# Patient Record
Sex: Male | Born: 1985 | Race: White | Hispanic: No | Marital: Married | Smoking: Never smoker
Health system: Southern US, Community
[De-identification: ages and names within clinical notes are randomized; demographics above are authoritative.]

## PROBLEM LIST (undated history)

## (undated) HISTORY — PX: WISDOM TOOTH EXTRACTION: SHX21

---

## 2005-03-30 ENCOUNTER — Emergency Department (HOSPITAL_COMMUNITY): Admission: EM | Admit: 2005-03-30 | Discharge: 2005-03-30 | Payer: Self-pay | Admitting: Emergency Medicine

## 2006-08-22 IMAGING — CR DG HAND COMPLETE 3+V*L*
3 series · 3 of 3 positions shown · non-contrast
Comparison: none

CLINICAL DATA: The patient fell from a dear stand today and has a large laceration on the palm of the left hand.   Right chest pain. 
 LEFT HAND - 3 VIEW: 
 There is no fracture, dislocation or radiodense foreign body.

[x hand ap left]
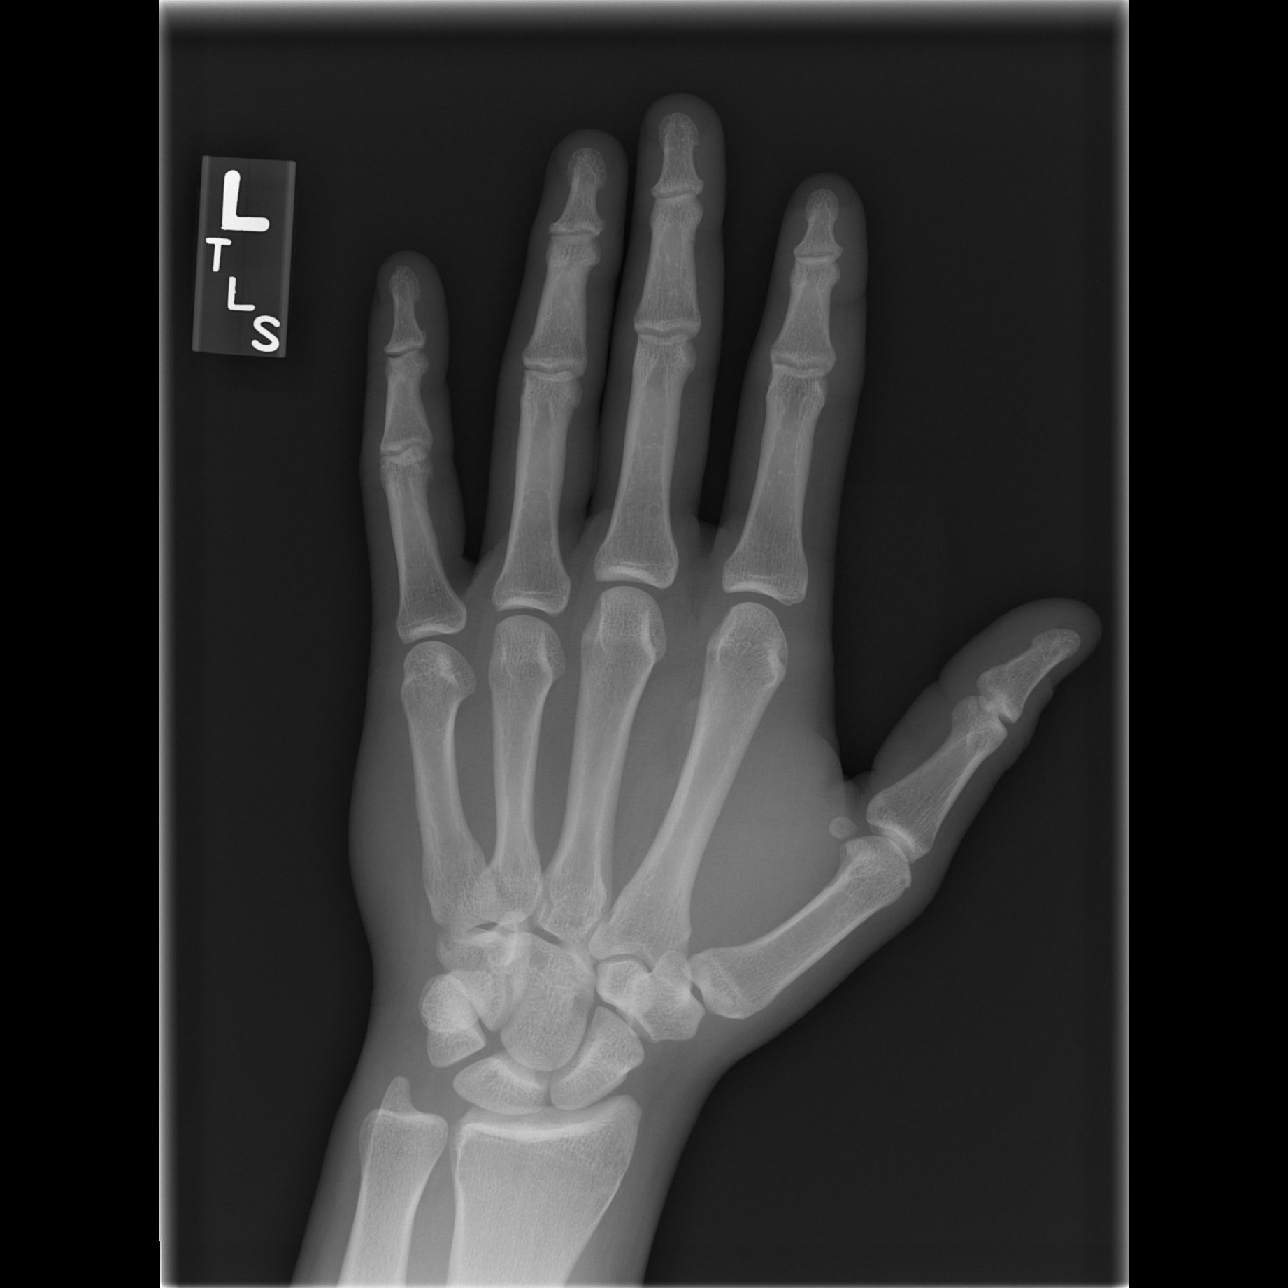

[x hand oblique left]
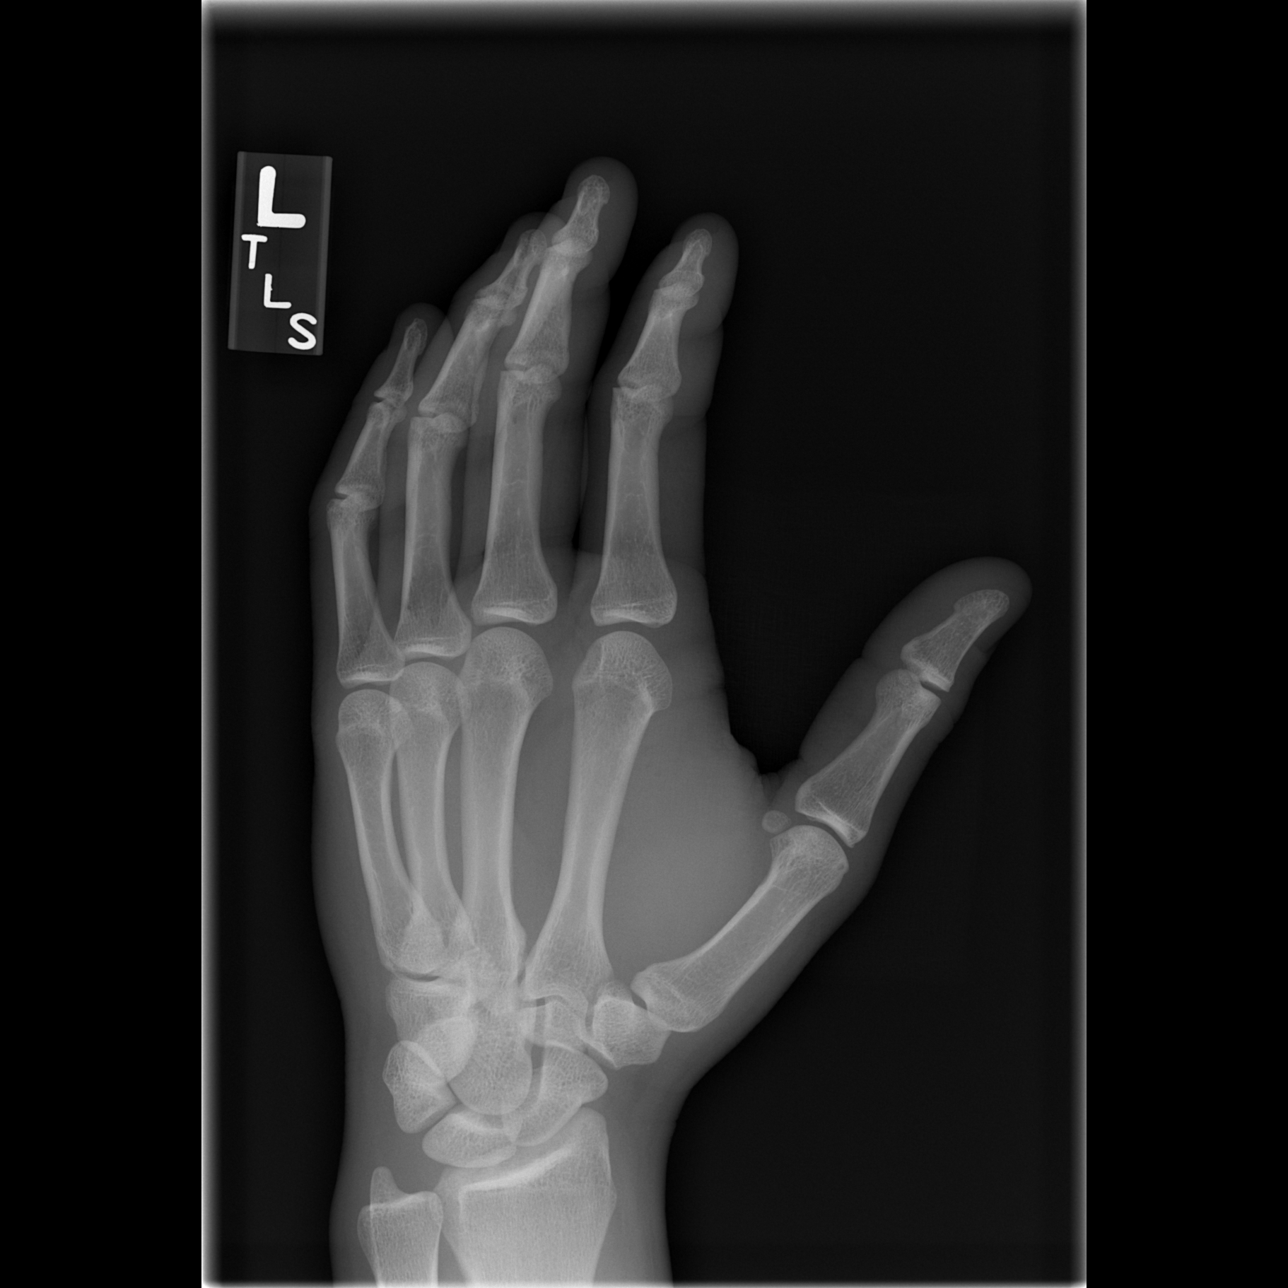

[x hand lat left]
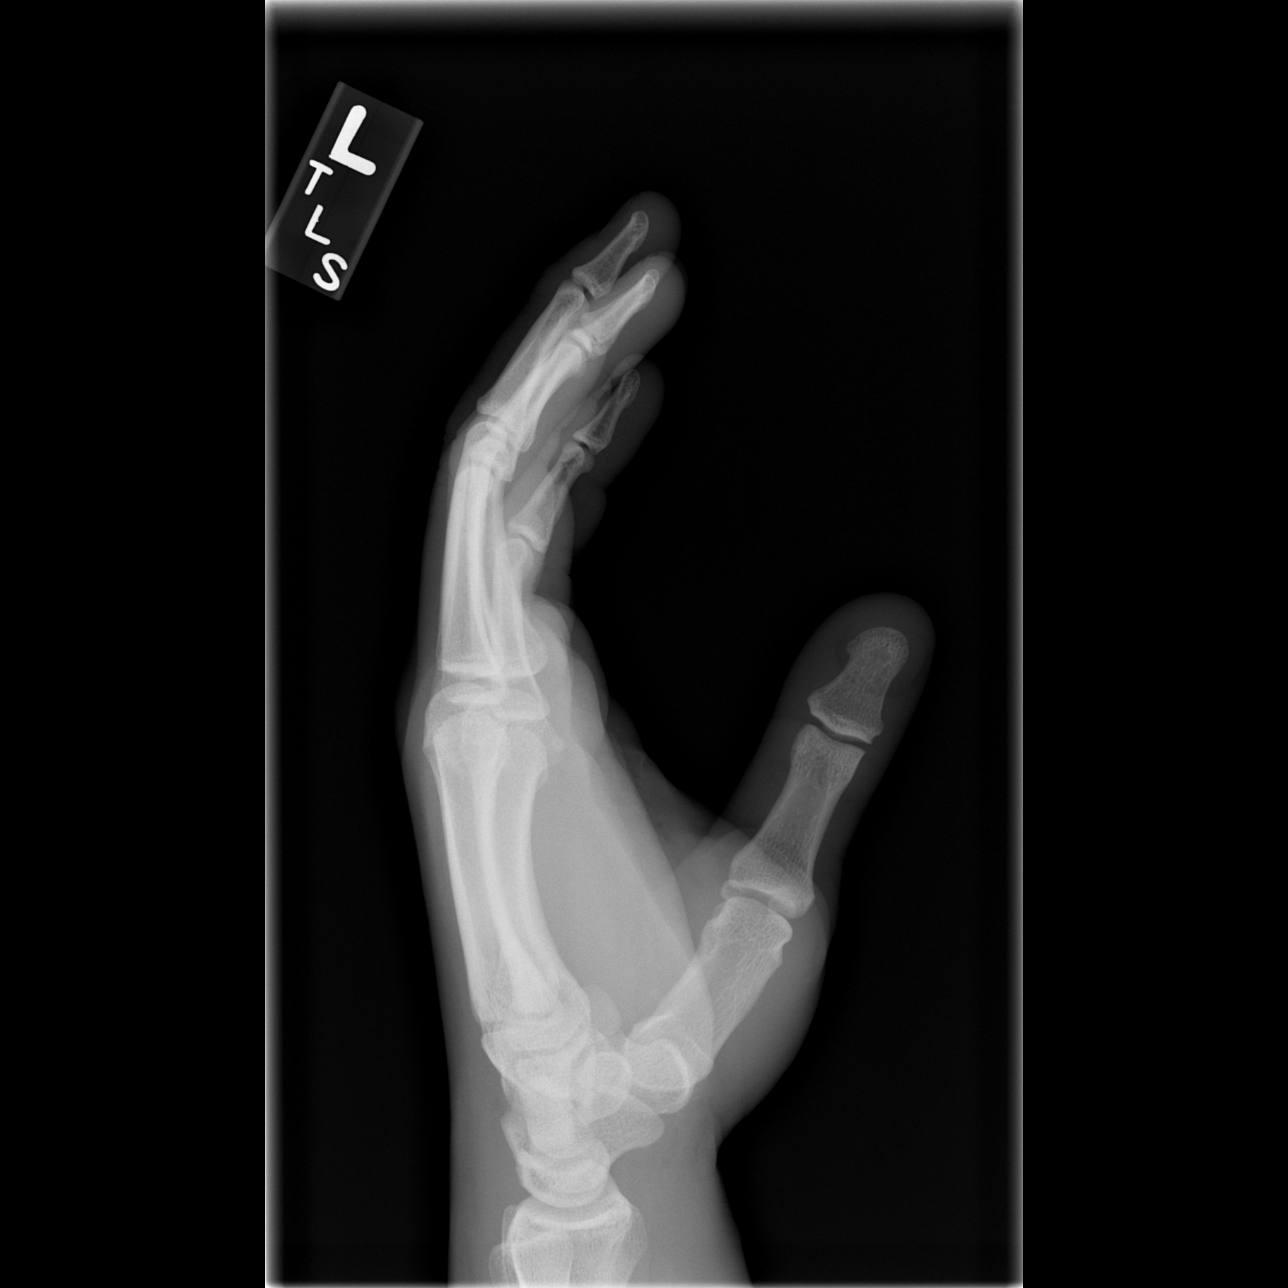

[3 of 3 positions shown; findings below may reference images not displayed]

IMPRESSION: Normal left hand. 
 CHEST - 2 VIEW: 
 The heart size and mediastinal contours are within normal limits.  Both lungs are clear.  The visualized skeletal structures are unremarkable.
IMPRESSION: No active cardiopulmonary disease.

## 2010-01-04 ENCOUNTER — Encounter: Admission: RE | Admit: 2010-01-04 | Discharge: 2010-01-04 | Payer: Self-pay | Admitting: Occupational Medicine

## 2015-10-21 ENCOUNTER — Other Ambulatory Visit: Payer: Self-pay | Admitting: Physician Assistant

## 2015-10-21 DIAGNOSIS — L02216 Cutaneous abscess of umbilicus: Secondary | ICD-10-CM

## 2015-10-22 ENCOUNTER — Ambulatory Visit
Admission: RE | Admit: 2015-10-22 | Discharge: 2015-10-22 | Disposition: A | Discharge status: Home / Self Care | Payer: 59 | Source: Ambulatory Visit | Attending: Physician Assistant | Admitting: Physician Assistant

## 2015-10-22 DIAGNOSIS — L02216 Cutaneous abscess of umbilicus: Secondary | ICD-10-CM

## 2019-09-29 ENCOUNTER — Emergency Department (HOSPITAL_BASED_OUTPATIENT_CLINIC_OR_DEPARTMENT_OTHER): Payer: 59

## 2019-09-29 ENCOUNTER — Other Ambulatory Visit: Payer: Self-pay

## 2019-09-29 ENCOUNTER — Encounter (HOSPITAL_BASED_OUTPATIENT_CLINIC_OR_DEPARTMENT_OTHER): Payer: Self-pay | Admitting: *Deleted

## 2019-09-29 ENCOUNTER — Emergency Department (HOSPITAL_BASED_OUTPATIENT_CLINIC_OR_DEPARTMENT_OTHER)
Admission: EM | Admit: 2019-09-29 | Discharge: 2019-09-29 | Disposition: A | Discharge status: Home / Self Care | Payer: 59 | Attending: Emergency Medicine | Admitting: Emergency Medicine

## 2019-09-29 DIAGNOSIS — R0789 Other chest pain: Secondary | ICD-10-CM | POA: Diagnosis present

## 2019-09-29 DIAGNOSIS — R0602 Shortness of breath: Secondary | ICD-10-CM | POA: Insufficient documentation

## 2019-09-29 DIAGNOSIS — R079 Chest pain, unspecified: Secondary | ICD-10-CM

## 2019-09-29 DIAGNOSIS — R05 Cough: Secondary | ICD-10-CM

## 2019-09-29 DIAGNOSIS — U071 COVID-19: Secondary | ICD-10-CM | POA: Diagnosis not present

## 2019-09-29 DIAGNOSIS — R059 Cough, unspecified: Secondary | ICD-10-CM

## 2019-09-29 LAB — CBC WITH DIFFERENTIAL/PLATELET
Abs Immature Granulocytes: 0.01 10*3/uL (ref 0.00–0.07)
Basophils Absolute: 0 10*3/uL (ref 0.0–0.1)
Basophils Relative: 0 %
Eosinophils Absolute: 0 10*3/uL (ref 0.0–0.5)
Eosinophils Relative: 1 %
HCT: 49.4 % (ref 39.0–52.0)
Hemoglobin: 16.9 g/dL (ref 13.0–17.0)
Immature Granulocytes: 0 %
Lymphocytes Relative: 37 %
Lymphs Abs: 2.2 10*3/uL (ref 0.7–4.0)
MCH: 29 pg (ref 26.0–34.0)
MCHC: 34.2 g/dL (ref 30.0–36.0)
MCV: 84.7 fL (ref 80.0–100.0)
Monocytes Absolute: 0.6 10*3/uL (ref 0.1–1.0)
Monocytes Relative: 10 %
Neutro Abs: 3 10*3/uL (ref 1.7–7.7)
Neutrophils Relative %: 52 %
Platelets: 262 10*3/uL (ref 150–400)
RBC: 5.83 MIL/uL — ABNORMAL HIGH (ref 4.22–5.81)
RDW: 12.9 % (ref 11.5–15.5)
WBC: 5.9 10*3/uL (ref 4.0–10.5)
nRBC: 0 % (ref 0.0–0.2)

## 2019-09-29 LAB — BASIC METABOLIC PANEL
Anion gap: 9 (ref 5–15)
BUN: 12 mg/dL (ref 6–20)
CO2: 23 mmol/L (ref 22–32)
Calcium: 9.3 mg/dL (ref 8.9–10.3)
Chloride: 104 mmol/L (ref 98–111)
Creatinine, Ser: 0.94 mg/dL (ref 0.61–1.24)
GFR calc Af Amer: >60 mL/min (ref >60)
GFR calc non Af Amer: >60 mL/min (ref >60)
Glucose, Bld: 97 mg/dL (ref 70–99)
Potassium: 3.8 mmol/L (ref 3.5–5.1)
Sodium: 136 mmol/L (ref 135–145)

## 2019-09-29 LAB — D-DIMER, QUANTITATIVE: D-Dimer, Quant: 0.32 ug/mL-FEU (ref 0.00–0.50)

## 2019-09-29 LAB — TROPONIN I (HIGH SENSITIVITY): Troponin I (High Sensitivity): <2 ng/L (ref <18)

## 2019-09-29 MED ORDER — GUAIFENESIN-CODEINE 100-10 MG/5ML PO SOLN: 5.0000 mL | Freq: Three times a day (TID) | ORAL | 0 refills | Status: AC | PRN

## 2019-09-29 MED ORDER — ALBUTEROL SULFATE HFA 108 (90 BASE) MCG/ACT IN AERS: 1.0000 | INHALATION_SPRAY | Freq: Four times a day (QID) | RESPIRATORY_TRACT | 0 refills | Status: AC | PRN

## 2019-09-29 MED ORDER — PREDNISONE 20 MG PO TABS: 40.0000 mg | ORAL_TABLET | Freq: Every day | ORAL | 0 refills | Status: DC

## 2019-09-29 NOTE — ED Provider Notes (Signed)
[facility] EMERGENCY DEPARTMENT Provider Note   CSN: [id number] Arrival date & time: 09/29/19  1450     History Chief Complaint  Patient presents with  . Covid Positive    Brandon Duke is a 34 y.o. male.  HPI 34 year old Caucasian male with no pertinent past medical history presents to the emergency department today for evaluation of chest pain or shortness of breath.  Patient reports that last week approximate 6 days ago he developed some nasal congestion.  He states that 2 days later he did receive the COVID-19 vaccine.  Patient states that on Saturday he began to losing his taste and smell.  Patient reports having some chest discomfort in the center of his chest.  This was nonradiating.  States that he does have wheezing which is what prompted him come to the ER.  Patient states that this is worse at night.  He denies any pleuritic chest pain.  No exertional chest pain.  Patient reports that he does not have any difficulties taking a deep breath but is concerned about the wheezing at times.  Patient did test positive for COVID-19 yesterday.  He denies any productive cough but does report a dry cough.  Patient has no fevers or chills.  Sick contacts in the house with similar symptoms.  Patient has no cardiac history.  Does report family history of cardiac disease.  Patient has no history of PE/DVT, prolonged immobilization, recent hospitalization/surgeries, unilateral leg swelling or calf tenderness, hemoptysis.  Patient denies any tobacco use.  No history of high blood pressure, hyperlipidemia, high cholesterol.    History reviewed. No pertinent past medical history.  There are no problems to display for this patient.   Past Surgical History:  Procedure Laterality Date  . WISDOM TOOTH EXTRACTION         No family history on file.  Social History   Tobacco Use  . Smoking status: Never Smoker  . Smokeless tobacco: Never Used  Substance Use Topics  . Alcohol use:  Never  . Drug use: Never    Home Medications Prior to Admission medications   Not on File    Allergies    Patient has no known allergies.  Review of Systems   Review of Systems  Constitutional: Negative for chills and fever.  HENT: Positive for congestion. Negative for sore throat.   Eyes: Negative for discharge.  Respiratory: Positive for cough, chest tightness, shortness of breath and wheezing.   Cardiovascular: Positive for chest pain. Negative for leg swelling.  Gastrointestinal: Negative for abdominal pain, diarrhea, nausea and vomiting.  Genitourinary: Negative for dysuria.  Musculoskeletal: Negative for myalgias.  Skin: Negative for color change.  Neurological: Negative for headaches.  Psychiatric/Behavioral: Negative for confusion.    Physical Exam Updated Vital Signs BP (!) 148/105   Pulse (!) 113   Temp 98.6 F (37 C) (Oral)   Resp 20   Ht 6' (1.829 m)   Wt 136.1 kg   SpO2 98%   BMI 40.69 kg/m   Physical Exam Vitals and nursing note reviewed.  Constitutional:      General: He is not in acute distress.    Appearance: He is well-developed. He is not ill-appearing or toxic-appearing.  HENT:     Head: Normocephalic and atraumatic.  Eyes:     General:        Right eye: No discharge.        Left eye: No discharge.     Conjunctiva/sclera: Conjunctivae normal.  Pupils: Pupils are equal, round, and reactive to light.  Cardiovascular:     Rate and Rhythm: Regular rhythm. Tachycardia present.     Heart sounds: Normal heart sounds. No murmur. No friction rub. No gallop.   Pulmonary:     Effort: Pulmonary effort is normal. No respiratory distress.     Breath sounds: Normal breath sounds. No stridor. No wheezing, rhonchi or rales.  Chest:     Chest wall: No tenderness.  Abdominal:     General: Bowel sounds are normal. There is no distension.     Palpations: Abdomen is soft.     Tenderness: There is no abdominal tenderness. There is no right CVA  tenderness, left CVA tenderness, guarding or rebound.  Musculoskeletal:        General: No tenderness. Normal range of motion.     Cervical back: Normal range of motion and neck supple.     Right lower leg: No edema.     Left lower leg: No edema.  Lymphadenopathy:     Cervical: No cervical adenopathy.  Skin:    General: Skin is warm and dry.     Capillary Refill: Capillary refill takes less than 2 seconds.     Findings: No rash.  Neurological:     Mental Status: He is alert and oriented to person, place, and time.  Psychiatric:        Mood and Affect: Mood normal.        Behavior: Behavior normal.        Thought Content: Thought content normal.        Judgment: Judgment normal.     ED Results / Procedures / Treatments   Labs (all labs ordered are listed, but only abnormal results are displayed) Labs Reviewed  CBC WITH DIFFERENTIAL/PLATELET - Abnormal; Notable for the following components:      Result Value   RBC 5.83 (*)    All other components within normal limits  BASIC METABOLIC PANEL  D-DIMER, QUANTITATIVE (NOT AT [facility])  TROPONIN I (HIGH SENSITIVITY)    EKG EKG Interpretation  Date/Time:  Monday September 29 2019 15:09:44 EST Ventricular Rate:  109 PR Interval:  134 QRS Duration: 92 QT Interval:  330 QTC Calculation: 444 R Axis:   15 Text Interpretation: Sinus tachycardia Inferior infarct , age undetermined Abnormal ECG No old tracing to compare Confirmed by Pricilla Loveless [phone]) on 09/29/2019 4:07:18 PM   Radiology DG Chest Portable 1 View  Result Date: 09/29/2019 CLINICAL DATA:  Covid Positive yesterday. Chest tightness and wheezing. Sinus pain. EXAM: PORTABLE CHEST 1 VIEW COMPARISON:  None. FINDINGS: The heart size and mediastinal contours are within normal limits. The lungs are clear. No pneumothorax or significant pleural effusion. The visualized skeletal structures are unremarkable. IMPRESSION: No evidence of active disease. Electronically Signed   By: Emmaline Kluver M.D.   On: 09/29/2019 16:35    Procedures Procedures (including critical care time)  Medications Ordered in ED Medications - No data to display  ED Course  I have reviewed the triage vital signs and the nursing notes.  Pertinent labs & imaging results that were available during my care of the patient were reviewed by me and considered in my medical decision making (see chart for details).    MDM Rules/Calculators/A&P                      34 year old male presents the ER for evaluation of chest pain, shortness of breath in the setting  of COVID-19 infection.  Patient has no cardiac history.  Denies any history of PE/DVT.  Patient initially tachycardic in the ER.  No hypoxia appreciated.  Patient concern for wheezing however on my examination there is no wheezing appreciated.  No lower extremity swelling.  No rubs murmurs or gallops appreciated.  Patient had EKG performed that shows sinus tachycardia with Q waves in inferior lead.  Patient has no signs of acute ST elevation or depression.  Given the patient does have some component of pleuritic chest pain will order D-dimer and basic lab work.  No leukocytosis.  No significant electrolyte abnormality.  D-dimer and troponin are pending at this time.  Chest x-ray which I personally reviewed shows no signs of focal intra concerning for pneumonia or pneumothorax.  If D-dimer is positive will need PE study.  If troponin is negative patient does not need a repeat troponin as pain is been ongoing for the past 24 hours.  Presentation seems very atypical for ACS.  Patient needs close outpatient follow-up.  Will prescribe short course of steroids, cough syrup and inhaler.  Patient signed out to oncoming provider PA-C Joy who will follow up on results and discharge patient accordingly.  She was aware of plan at time of handoff. Final Clinical Impression(s) / ED Diagnoses Final diagnoses:  SOB (shortness of breath)  Cough  Chest pain, unspecified  type  COVID-19    Rx / DC Orders ED Discharge Orders         Ordered    albuterol (VENTOLIN HFA) 108 (90 Base) MCG/ACT inhaler  Every 6 hours PRN     09/29/19 1812    predniSONE (DELTASONE) 20 MG tablet  Daily     09/29/19 1812    guaiFENesin-codeine 100-10 MG/5ML syrup  3 times daily PRN     09/29/19 1812           Rise Mu, PA-C 09/29/19 1816    Pricilla Loveless, MD 10/02/19 1547

## 2019-09-29 NOTE — ED Triage Notes (Signed)
Covid Positive yesterday. Chest tightness and wheezing. Sinus pain.

## 2019-09-29 NOTE — ED Provider Notes (Signed)
Brandon Duke is a 34 y.o. male, presenting to the ED with chest discomfort.  He tells me he only has the pain in his chest when he presses to the left of his sternum or takes a deep breath.  HPI from Azucena Kuba, PA-C: "34 year old Caucasian male with no pertinent past medical history presents to the emergency department today for evaluation of chest pain or shortness of breath.  Patient reports that last week approximate 6 days ago he developed some nasal congestion.  He states that 2 days later he did receive the COVID-19 vaccine.  Patient states that on Saturday he began to losing his taste and smell.  Patient reports having some chest discomfort in the center of his chest.  This was nonradiating.  States that he does have wheezing which is what prompted him come to the ER.  Patient states that this is worse at night.  He denies any pleuritic chest pain.  No exertional chest pain.  Patient reports that he does not have any difficulties taking a deep breath but is concerned about the wheezing at times.  Patient did test positive for COVID-19 yesterday.  He denies any productive cough but does report a dry cough.  Patient has no fevers or chills.  Sick contacts in the house with similar symptoms.  Patient has no cardiac history.  Does report family history of cardiac disease.  Patient has no history of PE/DVT, prolonged immobilization, recent hospitalization/surgeries, unilateral leg swelling or calf tenderness, hemoptysis.  Patient denies any tobacco use.  No history of high blood pressure, hyperlipidemia, high cholesterol."  History reviewed. No pertinent past medical history.    Physical Exam  BP (!) 148/105   Pulse (!) 113   Temp 98.6 F (37 C) (Oral)   Resp 20   Ht 6' (1.829 m)   Wt 136.1 kg   SpO2 98%   BMI 40.69 kg/m   Physical Exam Vitals and nursing note reviewed.  Constitutional:      General: He is not in acute distress.    Appearance: He is well-developed. He is obese. He is  not diaphoretic.  HENT:     Head: Normocephalic and atraumatic.     Mouth/Throat:     Mouth: Mucous membranes are moist.     Pharynx: Oropharynx is clear.  Eyes:     Conjunctiva/sclera: Conjunctivae normal.  Cardiovascular:     Rate and Rhythm: Normal rate and regular rhythm.     Pulses: Normal pulses.          Radial pulses are 2+ on the right side and 2+ on the left side.       Posterior tibial pulses are 2+ on the right side and 2+ on the left side.     Heart sounds: Normal heart sounds.  Pulmonary:     Effort: Pulmonary effort is normal. No respiratory distress.     Breath sounds: Normal breath sounds.     Comments: No increased work of breathing.  Speaks in full sentences without difficulty. Chest:     Chest wall: Tenderness present. No deformity, swelling or crepitus.    Abdominal:     Palpations: Abdomen is soft.     Tenderness: There is no abdominal tenderness. There is no guarding.  Musculoskeletal:     Cervical back: Neck supple.     Right lower leg: No edema.     Left lower leg: No edema.     Comments: No lower extremity pain, tenderness, swelling, color change,  or increased warmth.  Lymphadenopathy:     Cervical: No cervical adenopathy.  Skin:    General: Skin is warm and dry.  Neurological:     Mental Status: He is alert.  Psychiatric:        Mood and Affect: Mood and affect normal.        Speech: Speech normal.        Behavior: Behavior normal.     ED Course/Procedures    Procedures  Abnormal Labs Reviewed  CBC WITH DIFFERENTIAL/PLATELET - Abnormal; Notable for the following components:      Result Value   RBC 5.83 (*)    All other components within normal limits    DG Chest Portable 1 View  Result Date: 09/29/2019 CLINICAL DATA:  Covid Positive yesterday. Chest tightness and wheezing. Sinus pain. EXAM: PORTABLE CHEST 1 VIEW COMPARISON:  None. FINDINGS: The heart size and mediastinal contours are within normal limits. The lungs are clear. No  pneumothorax or significant pleural effusion. The visualized skeletal structures are unremarkable. IMPRESSION: No evidence of active disease. Electronically Signed   By: Audie Pinto M.D.   On: 09/29/2019 16:35    EKG Interpretation  Date/Time:  Monday September 29 2019 15:09:44 EST Ventricular Rate:  109 PR Interval:  134 QRS Duration: 92 QT Interval:  330 QTC Calculation: 444 R Axis:   15 Text Interpretation: Sinus tachycardia Inferior infarct , age undetermined Abnormal ECG No old tracing to compare Confirmed by Sherwood Gambler [phone]) on 09/29/2019 4:07:18 PM        MDM       Patient care handoff report received from [facility], PA-C. Plan: Awaiting troponin and D-dimer results.  If normal, patient may be discharged.  Patient evaluated prior to discharge.  He had no pain in the chest without palpation. Patient is nontoxic appearing, afebrile, not tachycardic on my exam, not tachypneic, not hypotensive, maintains excellent SPO2 on room air, and is in no apparent distress.   I have reviewed the patient's chart to obtain more information.  I reviewed and interpreted the patient's labs and radiological studies. Troponin negative.  D-dimer negative.  The patient was given instructions for home care as well as return precautions. Patient voices understanding of these instructions, accepts the plan, and is comfortable with discharge.   Vitals:   09/29/19 1501 09/29/19 1504 09/29/19 1824  BP:  (!) 148/105 135/86  Pulse:  (!) 113 96  Resp:  20 18  Temp:  98.6 F (37 C)   TempSrc:  Oral   SpO2:  98% 98%  Weight: 136.1 kg    Height: 6' (1.829 m)        Lorayne Bender, PA-C 09/30/19 0015    Sherwood Gambler, MD 10/02/19 [phone]

## 2019-09-29 NOTE — Discharge Instructions (Signed)
Your work-up has been reassuring in the emergency department today.  No signs of pneumonia.  I would use the inhaler for any wheezing or shortness of breath.  Can use a steroid starting today for the next 5 days.  Use the codeine cough syrup for cough.  This will make you drowsy be careful taking this.  Follow-up with primary care doctor.  Return to the ER with any worsening symptoms.

## 2020-07-29 ENCOUNTER — Other Ambulatory Visit: Payer: 59

## 2023-08-30 ENCOUNTER — Ambulatory Visit: Payer: 59 | Admitting: Podiatry

## 2023-09-07 ENCOUNTER — Ambulatory Visit (INDEPENDENT_AMBULATORY_CARE_PROVIDER_SITE_OTHER): Payer: 59

## 2023-09-07 ENCOUNTER — Ambulatory Visit: Payer: 59 | Admitting: Podiatry

## 2023-09-07 ENCOUNTER — Encounter: Payer: Self-pay | Admitting: Podiatry

## 2023-09-07 DIAGNOSIS — M216X2 Other acquired deformities of left foot: Secondary | ICD-10-CM | POA: Diagnosis not present

## 2023-09-07 DIAGNOSIS — M216X1 Other acquired deformities of right foot: Secondary | ICD-10-CM

## 2023-09-07 DIAGNOSIS — M722 Plantar fascial fibromatosis: Secondary | ICD-10-CM | POA: Diagnosis not present

## 2023-09-07 MED ORDER — DICLOFENAC SODIUM 75 MG PO TBEC: 75.0000 mg | DELAYED_RELEASE_TABLET | Freq: Two times a day (BID) | ORAL | 0 refills | Status: AC

## 2023-09-07 MED ORDER — TRIAMCINOLONE ACETONIDE 10 MG/ML IJ SUSP
5.0000 mg | Freq: Once | INTRAMUSCULAR | Status: AC
  Administered 2023-09-07: 5 mg

## 2023-09-07 NOTE — Progress Notes (Signed)
 Chief Complaint  Patient presents with   Plantar Fasciitis    Patient states that he has been dealing with plantar fasciitis in left foot for a year now, and has got 3 shots in the pass and nothing is still working.     HPI: 38 y.o. male presenting today for second opinion regarding care of fasciitis left foot.  Has had several rounds of injections back in November.  Previously has tried PT.  Currently using custom orthotics.  States that he cannot get lasting relief.  Does endorse left heel pain today. He does state he would like to avoid surgery if possible.  History reviewed. No pertinent past medical history.  Past Surgical History:  Procedure Laterality Date   WISDOM TOOTH EXTRACTION      No Known Allergies   Physical Exam: General: The patient is alert and oriented x3 in no acute distress.  Dermatology:  No ecchymosis, erythema, or edema bilateral.  No open lesions.    Vascular: Palpable pedal pulses bilaterally. Capillary refill within normal limits.  No appreciable edema.    Neurological: Light touch sensation intact bilateral.  MMT 5/5 to lower extremity bilateral. Negative Tinel's sign with percussion of the posterior tibial nerve on the affected extremity.    Musculoskeletal Exam:  There is pain on palpation of the plantarmedial & plantarcentral aspect of left heel.  No gaps or nodules within the plantar fascia.  Positive Windlass mechanism bilateral.  Antalgic gait noted with first few steps upon standing.  No pain on palpation of achilles tendon bilateral.  Ankle df less than 10 degrees with knee extended b/l.  Radiographic Exam: Left foot 09/07/2023 Normal osseous mineralization. Joint spaces preserved.  No fractures or osseous irregularities noted. Mild pes planus foot type. Mild plantar heel spur present, posterior heel spur at achilles insertion present.  Assessment/Plan of Care: 1. Plantar fasciitis of left foot   2. Acquired equinus deformity of both feet      Meds ordered this encounter  Medications   triamcinolone acetonide (KENALOG) 10 MG/ML injection 5 mg   diclofenac (VOLTAREN) 75 MG EC tablet    Sig: Take 1 tablet (75 mg total) by mouth 2 (two) times daily.    Dispense:  30 tablet    Refill:  0   MR FOOT LEFT WO CONTRAST  -Reviewed etiology of plantar fasciitis with patient.  Discussed treatment options with patient today, including cortisone injection, NSAID course of treatment, stretching exercises, physical therapy, use of night splint, rest, icing the heel, arch supports/orthotics, and supportive shoe gear.  He is currently using orthotics and has tried PT.  Last injection back in November.  With the patient's verbal consent, a corticosteroid injection was administered to the left heel, consisting of a mixture of 1% lidocaine plain, 0.5% Sensorcaine plain, and Kenalog-10 for a total of 1.5cc administered.  A Band-aid was applied.  Patient tolerated this well.  Starting patient on 2 weeks of oral diclofenac 75 mg twice daily.  Night splint dispensed to patient to be worn when sleeping and at rest.  MRI ordered to assess extent of plantar fascial thickening.  Suspect equinus playing a role in recalcitrant plantar fasciitis.  Did briefly discuss surgical intervention. Patient would like to exhaust conservative measures.  Return in about 3 weeks (around 09/28/2023) for Plantar Fasciitis.   Bronwen Betters DPM, AACFAS [facility]     [address]  [place], [place] [address]                Office [phone]  Fax [phone]

## 2023-09-07 NOTE — Patient Instructions (Signed)

## 2023-09-10 ENCOUNTER — Encounter: Payer: Self-pay | Admitting: Podiatry

## 2023-09-14 ENCOUNTER — Ambulatory Visit
Admission: RE | Admit: 2023-09-14 | Discharge: 2023-09-14 | Disposition: A | Discharge status: Home / Self Care | Payer: 59 | Source: Ambulatory Visit | Attending: Podiatry | Admitting: Podiatry

## 2023-09-14 DIAGNOSIS — M722 Plantar fascial fibromatosis: Secondary | ICD-10-CM

## 2023-09-28 ENCOUNTER — Ambulatory Visit: Payer: 59 | Admitting: Podiatry

## 2023-10-05 ENCOUNTER — Ambulatory Visit: Admitting: Podiatry

## 2023-10-05 DIAGNOSIS — M722 Plantar fascial fibromatosis: Secondary | ICD-10-CM

## 2023-10-05 DIAGNOSIS — M216X2 Other acquired deformities of left foot: Secondary | ICD-10-CM

## 2023-10-05 DIAGNOSIS — M216X1 Other acquired deformities of right foot: Secondary | ICD-10-CM

## 2023-10-05 NOTE — Patient Instructions (Signed)

## 2023-10-05 NOTE — Progress Notes (Signed)
 Chief Complaint  Patient presents with   Plantar Fasciitis    " I think I am doing rellay well and no pain"     HPI: 38 y.o. male presenting for follow-up evaluation of plantar fasciitis left foot.  He had injection last visit and has been using the fascial brace.  He also has been using night splint.  He states that he is doing great and not noticing any pain.  States that he has not really had relief to this extent previously in his bout with the heel pain.  No past medical history on file.  Past Surgical History:  Procedure Laterality Date   WISDOM TOOTH EXTRACTION      No Known Allergies   Physical Exam: General: The patient is alert and oriented x3 in no acute distress.  Dermatology:  No ecchymosis, erythema, or edema bilateral.  No open lesions.    Vascular: Palpable pedal pulses bilaterally. Capillary refill within normal limits.  No appreciable edema.    Neurological: Light touch sensation intact bilateral.  MMT 5/5 to lower extremity bilateral. Negative Tinel's sign with percussion of the posterior tibial nerve on the affected extremity.    Musculoskeletal Exam:  There is pain on palpation of the plantarmedial & plantarcentral aspect of left heel.  No gaps or nodules within the plantar fascia.  Positive Windlass mechanism bilateral.  Antalgic gait noted with first few steps upon standing.  No pain on palpation of achilles tendon bilateral.  Ankle df less than 10 degrees with knee extended b/l.  Radiographic Exam: Left foot 09/07/2023 Normal osseous mineralization. Joint spaces preserved.  No fractures or osseous irregularities noted. Mild pes planus foot type. Mild plantar heel spur present, posterior heel spur at achilles insertion present.  Left foot MRI 09/24/2023: IMPRESSION: 1. Mild distal Achilles tendinosis with peritendinitis. 2. Mild plantar fasciitis of the medial band of the plantar fascia at towards the calcaneal insertion with mild subcortical  marrow edema. 3. Mild subchondral marrow edema at the proximal medial corner of the base of the second metatarsal.  Assessment/Plan of Care: 1. Plantar fasciitis of left foot   2. Acquired equinus deformity of both feet     No orders of the defined types were placed in this encounter.  None  -Reviewed etiology of plantar fasciitis with patient.  Discussed treatment options with patient today, including cortisone injection, NSAID course of treatment, stretching exercises, physical therapy, use of night splint, rest, icing the heel, arch supports/orthotics, and supportive shoe gear.  He is currently using orthotics and has tried PT. Had injection last visit  Encouraged continued use of the fascial brace and continued stretching.  Patient states that he has old set of custom orthotics that he is reluctant to start using.  I do think that this could be helpful for him.  If he has any recurrence whatsoever I would like him to return to the office promptly and bring in the old inserts.  He does state that he has a trip to [place] coming up.  I advised that he take prophylactic Aleve and do his best to rest, ice and stretch whenever possible.  MRI results reviewed with patient   Return if symptoms worsen or fail to improve, for Plantar Fasciitis.   Bronwen Betters DPM, AACFAS [facility]     [address]  [place], [place] [address]                Office [phone]  Fax [phone]

## 2023-10-09 ENCOUNTER — Encounter: Payer: Self-pay | Admitting: Podiatry
